# Patient Record
Sex: Male | Born: 1985 | Race: White | Hispanic: No | Marital: Single | Smoking: Former smoker
Health system: Southern US, Community
[De-identification: ages and names within clinical notes are randomized; demographics above are authoritative.]

## PROBLEM LIST (undated history)

## (undated) DIAGNOSIS — IMO0002 Reserved for concepts with insufficient information to code with codable children: Secondary | ICD-10-CM

## (undated) DIAGNOSIS — K2970 Gastritis, unspecified, without bleeding: Secondary | ICD-10-CM

## (undated) DIAGNOSIS — R51 Headache: Secondary | ICD-10-CM

## (undated) DIAGNOSIS — G4700 Insomnia, unspecified: Secondary | ICD-10-CM

## (undated) HISTORY — DX: Reserved for concepts with insufficient information to code with codable children: IMO0002

## (undated) HISTORY — DX: Insomnia, unspecified: G47.00

## (undated) HISTORY — DX: Headache: R51

## (undated) HISTORY — DX: Gastritis, unspecified, without bleeding: K29.70

---
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
## 2003-10-09 ENCOUNTER — Emergency Department (HOSPITAL_COMMUNITY): Admission: EM | Admit: 2003-10-09 | Discharge: 2003-10-10 | Payer: Self-pay | Admitting: Emergency Medicine

## 2004-11-24 ENCOUNTER — Emergency Department (HOSPITAL_COMMUNITY): Admission: EM | Admit: 2004-11-24 | Discharge: 2004-11-25 | Payer: Self-pay | Admitting: Emergency Medicine

## 2005-01-07 ENCOUNTER — Emergency Department (HOSPITAL_COMMUNITY): Admission: EM | Admit: 2005-01-07 | Discharge: 2005-01-07 | Payer: Self-pay | Admitting: Emergency Medicine

## 2005-02-13 ENCOUNTER — Emergency Department (HOSPITAL_COMMUNITY): Admission: EM | Admit: 2005-02-13 | Discharge: 2005-02-13 | Payer: Self-pay | Admitting: Emergency Medicine

## 2005-06-12 ENCOUNTER — Encounter: Admission: RE | Admit: 2005-06-12 | Discharge: 2005-06-12 | Payer: Self-pay | Admitting: Occupational Medicine

## 2006-01-15 ENCOUNTER — Ambulatory Visit: Payer: Self-pay | Admitting: Gastroenterology

## 2007-08-09 ENCOUNTER — Emergency Department (HOSPITAL_COMMUNITY): Admission: EM | Admit: 2007-08-09 | Discharge: 2007-08-09 | Payer: Self-pay | Admitting: Emergency Medicine

## 2008-06-23 ENCOUNTER — Emergency Department (HOSPITAL_COMMUNITY): Admission: EM | Admit: 2008-06-23 | Discharge: 2008-06-23 | Payer: Self-pay | Admitting: Family Medicine

## 2008-11-30 ENCOUNTER — Emergency Department (HOSPITAL_COMMUNITY): Admission: EM | Admit: 2008-11-30 | Discharge: 2008-11-30 | Payer: Self-pay | Admitting: Family Medicine

## 2008-12-02 ENCOUNTER — Emergency Department (HOSPITAL_COMMUNITY): Admission: EM | Admit: 2008-12-02 | Discharge: 2008-12-02 | Payer: Self-pay | Admitting: Family Medicine

## 2010-12-03 LAB — CBC
MCHC: 34.8 g/dL (ref 30.0–36.0)
Platelets: 139 10*3/uL — ABNORMAL LOW (ref 150–400)
RBC: 4.86 MIL/uL (ref 4.22–5.81)
WBC: 8.5 10*3/uL (ref 4.0–10.5)

## 2010-12-03 LAB — POCT I-STAT, CHEM 8
Calcium, Ion: 1.04 mmol/L — ABNORMAL LOW (ref 1.12–1.32)
HCT: 42 % (ref 39.0–52.0)
Sodium: 135 mEq/L (ref 135–145)
TCO2: 26 mmol/L (ref 0–100)

## 2010-12-03 LAB — DIFFERENTIAL
Basophils Absolute: 0 10*3/uL (ref 0.0–0.1)
Eosinophils Absolute: 0 10*3/uL (ref 0.0–0.7)
Lymphocytes Relative: 12 % (ref 12–46)
Monocytes Relative: 13 % — ABNORMAL HIGH (ref 3–12)

## 2010-12-03 LAB — STREP A DNA PROBE: Group A Strep Probe: NEGATIVE

## 2010-12-03 LAB — POCT RAPID STREP A (OFFICE): Streptococcus, Group A Screen (Direct): NEGATIVE

## 2011-01-09 NOTE — Procedures (Signed)
[place]. [facility]  Patient:    Ronald Schwartz, Ronald Schwartz Visit Number: [id number] MRN: [id number]          Service Type: Attending:  Fayrene Fearing L. Randa Evens, M.D. Dictated by:   Llana Aliment. Randa Evens, M.D. Proc. Date: 06/27/01   CC:         Dr. Beverley Fiedler, [facility]                           Procedure Report  PROCEDURE:  Colonoscopy and biopsy.  MEDICATIONS:  Fentanyl 100 mcg, Versed 10 mg IV.  SCOPE:  Pediatric Olympus video colonoscope.  INDICATION:  A 25 year old male with persistent diarrhea, weight loss, heme-positive stool.  This is done to look for signs of Crohns disease or ulcerative colitis.  DESCRIPTION OF PROCEDURE:  The procedure had been explained to the patient and consent obtained.  With the patient in the left lateral decubitus position, the Olympus pediatric video colonoscope was inserted, advanced under direct visualization.  The prep was excellent.  We were able to advance to the cecum. The ileocecal valve was identified and entered.  The terminal ileum was endoscopically normal.  Random biopsies were taken for evidence of inflammatory bowel disease.  The scope was withdrawn back, and the colon was completely normal throughout.  Multiple biopsies were taken and placed in jar #2, labeled as "random colon."  The scope was withdrawn and the patient tolerated the procedure well.  ASSESSMENT:  Essentially normal colonoscopy to the terminal ileum endoscopically.  PLAN:  Will check pathology and will recommend that we treat him at this point for inflammatory bowel disease.  He is not on any medication.  I will give him an empiric trial of Bentyl. Dictated by:   Llana Aliment. Randa Evens, M.D. Attending:  Llana Aliment. Randa Evens, M.D. DD:  06/27/01 TD:  06/28/01 Job: [id number]

## 2011-01-09 NOTE — Consult Note (Signed)
. [facility]  Patient:    Ronald Schwartz, Ronald Schwartz                      MRN: [id number] Proc. Date: 11/02/00 Adm. Date:  [id number] Attending:  Gerrianne Scale CC:         Posey Rea, M.D.   Consultation Report  DATE OF BIRTH:  1985-11-03  CHIEF COMPLAINT:  Possible seizures.  HISTORY OF PRESENT ILLNESS:  Ronald Schwartz is a 25 year old, right-handed, Caucasian young man who had an episode of screaming, shaking, and jerking that occurred around 1:30 in the morning which awakened him from sleep and caused him to be brought to [facility].  The [place] had [place] and had spent some time in the sauna. He had gone upstairs, felt tired, and felt a burning sensation on the back. He thought maybe that he had gotten too much sun and went down into the sauna. He could only stand it for a couple of minutes and developed a headache. Went back upstairs and went to sleep.  He awakened with a tingling sensation of both legs and muscle cramps. His back, legs, and neck hurt. He had generalized headache. He felt that it hurt if he moved anything. He had rigors and stuttering speech while shaking. He did not lose consciousness, but he has very little memory for this.  We are told that the patient had a temperature of 103. This is not documented in the history and physical examination of the patient. 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 It showed no red blood cells, no white blood cells, and normal glucose and protein. Gram stain was negative.  It appeared to the physician that the patient had very significant myalgias, and he prescribed Vicodin. The patient was evaluated with a urinalysis that showed 0 to 1 red blood cell, 2 to 5 white blood cells, trace bacteria.  The patient was taken from [facility] back home and was admitted to [facility]. His  temperatures ranged from 98.1 up to 101.8. This morning, the patient had shaking and jerking movements of his body, especially in the truncal area without loss of consciousness. The patient was treated with 1 mg of Ativan and given a bolus of fosphenytoin and 2 g of Rocephin.  I was asked to see him to determine the etiology of his dysfunction and to make recommendations for workup and treatment.  LABORATORY DATA:  Pertinent negatives here are that the patient had normal lumbar puncture, glucose 63, protein 30. Normal comprehensive metabolic panel and basically normal CBC, other than a neutrophil percentage of 94%. However, white count was only 8700.  The patient also has had a CT scan of the brain which was normal and had an EEG which I have just read that shows a mild amount of slowing and fast activity consistent with the use of benzodiazepines. The patient has had both narcotic analgesics and benzodiazepines, and they show up in his urine.  PAST MEDICAL HISTORY:  The patient has been a very healthy young man without significant medical problems. He injured his knees playing football but never had arthroscopic surgery. He has not had any recent infectious processes involving the head, neck, lungs, GI, GU, rash, anemia, bruisibility, diabetes, or thyroid disease. No joint disease other than his knees. No neurologic problems.  BIRTH HISTORY:  The patient was 7 pounds  3 ounces and was supposedly born at 44 weeks. He did not have the manifestations of a postdates child. He was born vertex vaginal delivery. Had no complications and went home with his mother. Growth and development were normal.  CURRENT MEDICATIONS:  None.  ALLERGIES:  None.  IMMUNIZATIONS:  Up to date.  FAMILY HISTORY:  Positive for meningocele in his sister. It is also positive for spasms that his mother and sister have had ______ . There is no history of seizures, mental retardation, blindness, deafness, or  consanguinity.  SOCIAL HISTORY:  The patient lives with his stepfather, younger sister, and mother. He is a Holiday representative at [employer] and is doing well in all but biology, obtaining a C/D. He used to run long distances in track and played wide receiver and cornerback for football but stopped because of his knee injuries.  ______ is also that mother has hypertension, COPD. She is her late 33s. Father is in his mid 6s. He is essentially well.  PHYSICAL EXAMINATION:  VITAL SIGNS:  Height 6 feet 1 inch, weight 151 pounds, head circumference 55 cm, temperature 98.6, blood pressure 149/74 (he is in pain), pulse 85, respirations 23, pulse oximetry 97%.  GENERAL:  This is a pleasant, well-developed, brown-haired, right-handed, young man in no distress.  HEENT:  No signs of infection.  LUNGS:  Clear to auscultation.  HEART:  No murmurs. Pulses normal.  ABDOMEN:  Soft, nontender. Bowel sounds normal.  EXTREMITIES:  Well-formed without cyanosis, edema, alterations in tone, or tight heel cords.  NEUROLOGICAL:  Mental status:  The patient was awake, alert, attentive, appropriate. No dysphagia or dyspraxia. Cranial nerve examination:  Round, reactive pupils. Normal fundi. Full visual fields to double simultaneous stimuli. Extraocular movements full and conjugate. OKN response is equal bilaterally. Visual acuity 20/30 o.d, 20/20 o.s. Symmetric facial strength. Midline tongue and uvula. Air conduction greater than bone conduction bilaterally.  MOTOR EXAMINATION:  Normal strength, tone, and mass. Good fine motor movements. No pronator drift. Sensation intact to cold, vibration, stereognosis. Cerebellar examination good finger-to-nose and rapid repetitive movements. Gait was not tested. He complained of back pain. He also complained of blurred vision which I suspect is related to the medicines he took. Deep tendon reflexes are diminished symmetrically. He had bilateral  flexor-plantar responses.   IMPRESSION:  Viral syndrome with rigors. Today, I have no explanation for his movement disorder. Without loss of consciousness, this is not seizures.  I appreciate the opportunity to see the patient. If you have questions or I can be of assistance, do not hesitate to contact me. DD:  11/02/00 TD:  11/02/00 Job: [id number]

## 2011-01-09 NOTE — Discharge Summary (Signed)
[place]. [facility]  Patient:    Ronald Schwartz, Ronald Schwartz                      MRN: [id number] Adm. Date:  [id number] Disc. Date: [id number] Attending:  Gerrianne Scale Dictator:   Anthony Sar CC:         Barton Fanny, M.D., [facility]  Deanna Artis. Sharene Skeans, M.D.   Discharge Summary  PRIMARY CARE PHYSICIAN:  Barton Fanny, M.D.  CONSULTATIONS PERFORMED:  Pediatric neurology, Deanna Artis. Sharene Skeans, M.D.  PROCEDURES: 1. An electroencephalogram was performed, which was found to be normal. 2. An electrocardiogram was normal.  DISCHARGE DIAGNOSES: 1. Shaking/jerking movements and muscle spasms, not seizures. 2. Viral illness.  DIET:  Regular.  HISTORY OF PRESENT ILLNESS:  Ronald Schwartz was admitted to [facility] on November 01, 2000, for episodes of shaking and jerking movements, headache, low-grade fever, and altered mental status.  His symptoms began the day before when he was in [place], [place] Ronald Schwartz.  He went to an outside hospital there where a lumbar puncture was performed, which revealed normal cerebrospinal fluid.  HOSPITAL COURSE:  Upon admission to our hospital, Ronald Schwartz was found to have a normal CT scan of the head.  Liver function enzymes and blood culture were also normal.  During the course of his hospitalization there, he had several episodes of muscle spasm and jerking movements which lasted anywhere from 10-20 minutes.  We initially managed the episode of movements with some Ativan, however, no medication was given for the subsequent episodes.  An EEG recording was obtained and read to be normal by pediatric neurology.  On discharge today, Ronald Schwartz is feeling better.  His headaches have resolved and his episodes of spasm this morning were shorter in duration and less in severity.  He is being discharged to home with follow-up plans to make an appointment with a person that he feels comfortable to talk with, either  his pastor, his guidance counselor at school, or a psychologist, so that he may discuss any issues that may be causing him stress or anxiety.  The pediatric attending here, Gerrianne Scale, M.D., has talked with [facility] primary care physician, Barton Fanny, M.D., regarding [facility] stay here at the hospital.  A copy of this discharge summary will be sent to Dr. Luciana Axe.  Also, a note from the pediatric psychologist, who talked with [facility], will be forwarded to Dr. Luciana Axe. DD:  11/03/00 TD:  11/04/00 Job: [id number]

## 2011-03-04 ENCOUNTER — Ambulatory Visit: Payer: Self-pay | Admitting: Internal Medicine

## 2011-03-04 DIAGNOSIS — Z0289 Encounter for other administrative examinations: Secondary | ICD-10-CM

## 2011-11-24 ENCOUNTER — Ambulatory Visit (INDEPENDENT_AMBULATORY_CARE_PROVIDER_SITE_OTHER): Payer: 59 | Admitting: Family Medicine

## 2011-11-24 VITALS — BP 108/74 | HR 64 | Temp 97.9°F | Resp 18 | Ht 73.5 in | Wt 194.0 lb

## 2011-11-24 DIAGNOSIS — Z113 Encounter for screening for infections with a predominantly sexual mode of transmission: Secondary | ICD-10-CM

## 2011-11-24 DIAGNOSIS — Z131 Encounter for screening for diabetes mellitus: Secondary | ICD-10-CM

## 2011-11-24 DIAGNOSIS — Z0000 Encounter for general adult medical examination without abnormal findings: Secondary | ICD-10-CM

## 2011-11-24 DIAGNOSIS — Z13220 Encounter for screening for lipoid disorders: Secondary | ICD-10-CM

## 2011-11-24 LAB — POCT CBC
Granulocyte percent: 73.9 %G (ref 37–80)
HCT, POC: 46.2 % (ref 43.5–53.7)
Hemoglobin: 14.9 g/dL (ref 14.1–18.1)
Lymph, poc: 1.5 (ref 0.6–3.4)
MCH, POC: 27.9 pg (ref 27–31.2)
MCHC: 32.3 g/dL (ref 31.8–35.4)
MCV: 86.5 fL (ref 80–97)
MID (cbc): 0.5 (ref 0–0.9)
MPV: 9.4 fL (ref 0–99.8)
POC Granulocyte: 5.7 (ref 2–6.9)
POC LYMPH PERCENT: 19.3 % (ref 10–50)
POC MID %: 6.8 % (ref 0–12)
Platelet Count, POC: 254 10*3/uL (ref 142–424)
RBC: 5.34 M/uL (ref 4.69–6.13)
RDW, POC: 13.7 %
WBC: 7.7 10*3/uL (ref 4.6–10.2)

## 2011-11-24 LAB — COMPREHENSIVE METABOLIC PANEL WITH GFR
ALT: 16 U/L (ref 0–53)
AST: 19 U/L (ref 0–37)
CO2: 26 meq/L (ref 19–32)
Chloride: 102 meq/L (ref 96–112)
Creat: 1.08 mg/dL (ref 0.50–1.35)
Glucose, Bld: 89 mg/dL (ref 70–99)
Potassium: 4.1 meq/L (ref 3.5–5.3)

## 2011-11-24 LAB — COMPREHENSIVE METABOLIC PANEL
Albumin: 5.1 g/dL (ref 3.5–5.2)
Alkaline Phosphatase: 62 U/L (ref 39–117)
BUN: 10 mg/dL (ref 6–23)
Calcium: 9.7 mg/dL (ref 8.4–10.5)
Sodium: 141 mEq/L (ref 135–145)
Total Bilirubin: 0.4 mg/dL (ref 0.3–1.2)
Total Protein: 7.5 g/dL (ref 6.0–8.3)

## 2011-11-24 LAB — POCT GLYCOSYLATED HEMOGLOBIN (HGB A1C): Hemoglobin A1C: 5

## 2011-11-24 NOTE — Progress Notes (Signed)
  [facility]:  Office Visit  Chief Complaint:  Chief Complaint  Patient presents with  . Employment Physical    for work [place] seas    HPI: Ronald Schwartz is a 26 y.o. male who complains of  CPE. No medical problems. Very strong history of heart disease, diabetes.  Father is deceased, had first MI in late 31s, early 63s.  Mother is alive recently dx with lung cancer, first MI 28 y/o s/p triple bypass in 2010.   Past Medical History  Diagnosis Date  . Gastritis    History reviewed. No pertinent past surgical history. History   Social History  . Marital Status: Single    Spouse Name: N/A    Number of Children: N/A  . Years of Education: N/A   Social History Main Topics  . Smoking status: Former Smoker    Quit date: 11/23/2005  . Smokeless tobacco: Former Neurosurgeon  . Alcohol Use: Yes     3-5 beers per night  occasion  . Drug Use: None  . Sexually Active: None   Other Topics Concern  . None   Social History Narrative  . None   Family History  Problem Relation Age of Onset  . Heart disease Mother   . Hyperlipidemia Mother   . COPD Mother   . Stroke Mother   . Diabetes Father   . Heart disease Father    Allergies no known allergies Prior to Admission medications   Not on File     ROS: The patient denies fevers, chills, night sweats, unintentional weight loss, chest pain, palpitations, wheezing, dyspnea on exertion, nausea, vomiting, abdominal pain, dysuria, hematuria, melena, numbness, weakness, or tingling.   All other systems have been reviewed and were otherwise negative with the exception of those mentioned in the HPI and as above.    PHYSICAL EXAM: Filed Vitals:   11/24/11 1327  BP: 108/74  Pulse: 64  Temp: 97.9 F (36.6 C)  Resp: 18   Filed Vitals:   11/24/11 1327  Height: 6' 1.5" (1.867 m)  Weight: 194 lb (87.998 kg)   Body mass index is 25.25 kg/(m^2).  General: Alert, no acute distress HEENT:  Normocephalic, atraumatic,  oropharynx patent. EOMI, PERRLA, fundoscopic exam nl.  Cardiovascular:  Regular rate and rhythm, no rubs murmurs or gallops.  No Carotid bruits, radial pulse intact. No pedal edema.  Respiratory: Clear to auscultation bilaterally.  No wheezes, rales, or rhonchi.  No cyanosis, no use of accessory musculature GI: No organomegaly, abdomen is soft and non-tender, positive bowel sounds.  No masses. Skin: No rashes. Neurologic: Facial musculature symmetric. Psychiatric: Patient is appropriate throughout our interaction. Lymphatic: No cervical lymphadenopathy Musculoskeletal: Gait intact. GU: no testicular mas. Lesion, no rashes, scrotum normal, no hernias   LABS:  EKG/XRAY:   Primary read interpreted by Dr. Conley Rolls at [facility].   ASSESSMENT/PLAN: Encounter Diagnoses  Name Primary?  . Annual physical exam Yes  . Screening for hyperlipidemia   . Screening for diabetes mellitus   . Screening for STD (sexually transmitted disease)    Patient is doing well. He is healthy, has not had any problems medically this year. Has a strong h/o MI and cardiac disease in family. Will do screening for all the pertinent things.  Patient will come back for fasting lipid since just ate hamburger 2 hours ago.    Miia Blanks PHUONG, DO 11/24/2011 2:50 PM

## 2011-11-25 LAB — GC/CHLAMYDIA PROBE AMP, URINE
Chlamydia, Swab/Urine, PCR: NEGATIVE
GC Probe Amp, Urine: NEGATIVE

## 2011-11-25 LAB — RPR

## 2011-11-25 LAB — HSV(HERPES SIMPLEX VRS) I + II AB-IGG
HSV 1 Glycoprotein G Ab, IgG: 10.32 IV — ABNORMAL HIGH
HSV 2 Glycoprotein G Ab, IgG: <0.1 IV

## 2011-11-25 LAB — HIV ANTIBODY (ROUTINE TESTING W REFLEX): HIV: NONREACTIVE

## 2011-12-17 ENCOUNTER — Encounter: Payer: Self-pay | Admitting: Family Medicine

## 2012-01-04 ENCOUNTER — Encounter: Payer: Self-pay | Admitting: Family

## 2012-01-04 ENCOUNTER — Other Ambulatory Visit: Payer: Self-pay | Admitting: Family

## 2012-01-04 ENCOUNTER — Ambulatory Visit (INDEPENDENT_AMBULATORY_CARE_PROVIDER_SITE_OTHER): Payer: Self-pay | Admitting: Family

## 2012-01-04 VITALS — BP 140/90 | Temp 98.9°F | Ht 75.0 in | Wt 204.0 lb

## 2012-01-04 DIAGNOSIS — R51 Headache: Secondary | ICD-10-CM

## 2012-01-04 DIAGNOSIS — H538 Other visual disturbances: Secondary | ICD-10-CM

## 2012-01-04 LAB — CBC WITH DIFFERENTIAL/PLATELET
Basophils Absolute: 0 10*3/uL (ref 0.0–0.1)
Eosinophils Absolute: 0.4 10*3/uL (ref 0.0–0.7)
Eosinophils Relative: 6.1 % — ABNORMAL HIGH (ref 0.0–5.0)
Hemoglobin: 14.6 g/dL (ref 13.0–17.0)
Lymphocytes Relative: 22.8 % (ref 12.0–46.0)
Lymphs Abs: 1.3 10*3/uL (ref 0.7–4.0)
Monocytes Absolute: 0.5 10*3/uL (ref 0.1–1.0)
Monocytes Relative: 7.9 % (ref 3.0–12.0)
Neutrophils Relative %: 62.5 % (ref 43.0–77.0)
RDW: 13.1 % (ref 11.5–14.6)

## 2012-01-04 LAB — BASIC METABOLIC PANEL
CO2: 27 mEq/L (ref 19–32)
Calcium: 8.3 mg/dL — ABNORMAL LOW (ref 8.4–10.5)
Chloride: 98 mEq/L (ref 96–112)
GFR: 85.6 mL/min (ref >60.00)
Glucose, Bld: 64 mg/dL — ABNORMAL LOW (ref 70–99)
Potassium: 3.8 mEq/L (ref 3.5–5.1)
Sodium: 139 mEq/L (ref 135–145)

## 2012-01-04 LAB — TSH: TSH: 0.9 u[IU]/mL (ref 0.35–5.50)

## 2012-01-04 MED ORDER — TRAZODONE HCL 50 MG PO TABS: 25.0000 mg | ORAL_TABLET | Freq: Every evening | ORAL | Status: AC | PRN

## 2012-01-04 MED ORDER — TRAMADOL HCL 50 MG PO TABS
50.0000 mg | ORAL_TABLET | Freq: Four times a day (QID) | ORAL | Status: AC | PRN
Start: 2012-01-04 — End: 2012-01-14

## 2012-01-04 NOTE — Telephone Encounter (Signed)
Pt would like to have something for headaches and sleeping call into [facility] [phone]

## 2012-01-04 NOTE — Patient Instructions (Signed)

## 2012-01-04 NOTE — Telephone Encounter (Signed)
Done

## 2012-01-04 NOTE — Progress Notes (Signed)
Subjective:    Patient ID: Ronald Schwartz, male    DOB: [birth year], 26 y.o.   MRN: [id number]  HPI 26 year old white male, nonsmoker, new patient to the practice is in with complaints of a constant headache times one month. The headache originally began in the back dictated and has spread to his entire head. He rates the headache today a 4/10 but can be as bad as a 10 out of 10. Describes the headache throbbing. He began taken and allergy medication and migraine medication in the there have been effective. Does admit to nausea periodically, but no vomiting, no sneezing, cough, congestion. He consumes about 3-once bottles of caffeine a day. Denies any increase in stress. He is employed as a Emergency planning/management officer.  Review of Systems  Constitutional: Negative.   HENT: Negative for congestion, rhinorrhea, postnasal drip and sinus pressure.   Eyes: Positive for visual disturbance. Negative for photophobia and redness.  Respiratory: Negative.   Cardiovascular: Negative.   Gastrointestinal: Negative.   Genitourinary: Negative.   Musculoskeletal: Negative.   Skin: Negative.   Neurological: Positive for headaches. Negative for dizziness and light-headedness.  Psychiatric/Behavioral: Negative.    Past Medical History  Diagnosis Date  . Gastritis   . Insomnia   . Ulcer   . Headache     History   Social History  . Marital Status: Single    Spouse Name: N/A    Number of Children: N/A  . Years of Education: N/A   Occupational History  . Not on file.   Social History Main Topics  . Smoking status: Former Smoker    Quit date: 11/23/2005  . Smokeless tobacco: Former Neurosurgeon  . Alcohol Use: Yes     3-5 beers per night  occasion  . Drug Use: No  . Sexually Active: Not on file   Other Topics Concern  . Not on file   Social History Narrative  . No narrative on file    No past surgical history on file.  Family History  Problem Relation Age of Onset  . Heart disease Mother   .  Hyperlipidemia Mother   . COPD Mother   . Stroke Mother   . Diabetes Father   . Heart disease Father     No Known Allergies  No current outpatient prescriptions on file prior to visit.    BP 140/90  Temp(Src) 98.9 F (37.2 C) (Oral)  Ht 6\' 3"  (1.905 m)  Wt 204 lb (92.534 kg)  BMI 25.50 kg/m2chart    Objective:   Physical Exam  Constitutional: He is oriented to person, place, and time. He appears well-developed and well-nourished.  HENT:  Head: Normocephalic and atraumatic.  Right Ear: External ear normal.  Left Ear: External ear normal.  Nose: Nose normal.  Mouth/Throat: Oropharynx is clear and moist.  Eyes: Conjunctivae and EOM are normal. Pupils are equal, round, and reactive to light.  Neck: Normal range of motion. Neck supple.  Cardiovascular: Normal rate, regular rhythm and normal heart sounds.   Pulmonary/Chest: Effort normal and breath sounds normal.  Abdominal: Soft. Bowel sounds are normal.  Musculoskeletal: Normal range of motion.  Neurological: He is alert and oriented to person, place, and time. He has normal reflexes.  Skin: Skin is warm and dry.  Psychiatric: He has a normal mood and affect.          Assessment & Plan:  Assessment: Headache, blurry vision  Plan: Labs today to include ESR, BMP, CBC, TSH. Will notify patient pending  results. CT scan of the head without contrast given his family history and the characteristics of his headache. Will discuss further treatment plan at that time. Encouraged him to decrease his caffeine intake in the meantime. Drink more water. Patient to call the office if symptoms worsen or persist. Recheck a schedule, and when necessary.

## 2012-01-05 ENCOUNTER — Telehealth: Payer: Self-pay

## 2012-01-05 NOTE — Telephone Encounter (Signed)
Message copied by Beverely Low on Tue Jan 05, 2012  4:10 PM ------      Message from: Adline Mango B      Created: Tue Jan 05, 2012 10:47 AM       Labs stable. Proceed with CT head

## 2012-01-05 NOTE — Telephone Encounter (Signed)
Pt aware and notes that his CT is scheduled for tomorrow at 4.  He also notes that meds are not working at all. He has had 3 doses of the tramadol that has done nothing for him. States that he had a bad episode last night. Pt notes that after taking the trazodone he was awake again form 1-5am. He would like to possibly try a migraine medication

## 2012-01-05 NOTE — Telephone Encounter (Signed)
Per Padonda, provide pt with samples of relpax and zomig. Advise pt of instructions and make pt aware that he should not take these meds simultaneously. Pt should take one at onset of HA and another 2 hrs later if migraine is not better. Advised pt that he should also rest after taking meds. He is aware and verbalized understanding. Samples ready to pick up

## 2012-01-05 NOTE — Telephone Encounter (Signed)
Message copied by Beverely Low on Tue Jan 05, 2012  4:20 PM ------      Message from: Adline Mango B      Created: Tue Jan 05, 2012 10:47 AM       Labs stable. Proceed with CT head

## 2012-01-06 ENCOUNTER — Ambulatory Visit (INDEPENDENT_AMBULATORY_CARE_PROVIDER_SITE_OTHER)
Admission: RE | Admit: 2012-01-06 | Discharge: 2012-01-06 | Disposition: A | Discharge status: Home / Self Care | Payer: 59 | Source: Ambulatory Visit | Attending: Family | Admitting: Family

## 2012-01-06 DIAGNOSIS — H538 Other visual disturbances: Secondary | ICD-10-CM

## 2012-01-06 DIAGNOSIS — R51 Headache: Secondary | ICD-10-CM

## 2012-02-03 ENCOUNTER — Telehealth: Payer: Self-pay | Admitting: Family

## 2012-02-03 NOTE — Telephone Encounter (Signed)
Ok to complete note? 

## 2012-02-03 NOTE — Telephone Encounter (Signed)
Pt is a Designer, fashion/clothing and needs a note stating that it is beneficial for him to wear a Nylon Belt instead of the standard leather belt due to his back issues. Pt states the nylon belt is better on his back

## 2012-02-08 NOTE — Telephone Encounter (Signed)
Left message to advise pt to call back to let me know if he wants to pick letter up, fax, or mail

## 2012-02-09 NOTE — Telephone Encounter (Signed)
Pt will pick note up 02/10/12

## 2012-03-26 ENCOUNTER — Encounter: Payer: Self-pay | Admitting: Family Medicine

## 2012-03-26 ENCOUNTER — Ambulatory Visit (INDEPENDENT_AMBULATORY_CARE_PROVIDER_SITE_OTHER): Payer: 59 | Admitting: Family Medicine

## 2012-03-26 VITALS — BP 120/80 | Temp 98.4°F | Wt 199.0 lb

## 2012-03-26 DIAGNOSIS — T6391XA Toxic effect of contact with unspecified venomous animal, accidental (unintentional), initial encounter: Secondary | ICD-10-CM

## 2012-03-26 DIAGNOSIS — T63621A Toxic effect of contact with other jellyfish, accidental (unintentional), initial encounter: Secondary | ICD-10-CM

## 2012-03-26 MED ORDER — PREDNISONE 20 MG PO TABS: ORAL_TABLET | ORAL | Status: AC

## 2012-03-26 MED ORDER — TRIAMCINOLONE ACETONIDE 0.1 % EX CREA: TOPICAL_CREAM | Freq: Two times a day (BID) | CUTANEOUS | Status: AC

## 2012-03-26 NOTE — Progress Notes (Signed)
   Nature conservation officer at [facility] [address] [place] [address] Phone: [phone] Fax: [phone]  Date:  03/26/2012   Name:  Ronald Schwartz   DOB:  08-01-1986   MRN:  [id number]  PCP:  Janell Quiet, FNP    Chief Complaint: Rash   History of Present Illness:  Ronald Schwartz is a 26 y.o. very pleasant male patient who presents with the following:  2 weeks ago, stung by jellyfish, went up his shorts. Initially burning, painful rash, then develops itchiness that will not go away. Is a Emergency planning/management officer, has to wear a bulletproof vest, hot.  Past Medical History, Surgical History, Social History, Family History, Problem List, Medications, and Allergies have been reviewed and updated if relevant.  Current Outpatient Prescriptions on File Prior to Visit  Medication Sig Dispense Refill  . traZODone (DESYREL) 50 MG tablet Take 0.5-1 tablets (25-50 mg total) by mouth at bedtime as needed for sleep.  30 tablet  0    Review of Systems:  GEN: No acute illnesses, no fevers, chills. GI: No n/v/d, eating normally Pulm: No SOB Interactive and getting along well at home.  Otherwise, ROS is as per the HPI.   Physical Examination: Filed Vitals:   03/26/12 0908  BP: 120/80  Temp: 98.4 F (36.9 C)   Filed Vitals:   03/26/12 0908  Weight: 199 lb (90.266 kg)   There is no height on file to calculate BMI. Ideal Body Weight:     GEN: WDWN, NAD, Non-toxic, Alert & Oriented x 3 HEENT: Atraumatic, Normocephalic.  Ears and Nose: No external deformity. EXTR: No clubbing/cyanosis/edema NEURO: Normal gait.  PSYCH: Normally interactive. Conversant. Not depressed or anxious appearing.  Calm demeanor.  SKIN: Some on legs, post lower back, gluteal folds and groin with red macular rash with small, raised scatted bumps  Assessment and Plan:  1. Jellyfish sting  predniSONE (DELTASONE) 20 MG tablet, triamcinolone cream (KENALOG) 0.1 %    Orders Today:  No orders of the  defined types were placed in this encounter.    Medications Today: (Includes new updates added during medication reconciliation) Meds ordered this encounter  Medications  . predniSONE (DELTASONE) 20 MG tablet    Sig: 2 tabs po daily for 3 days, then 1 po daily for 4 days    Dispense:  10 tablet    Refill:  0  . triamcinolone cream (KENALOG) 0.1 %    Sig: Apply topically 2 (two) times daily.    Dispense:  85.2 g    Refill:  1     Hannah Beat, MD

## 2013-01-06 ENCOUNTER — Encounter: Payer: 59 | Admitting: Family

## 2013-01-06 DIAGNOSIS — Z0289 Encounter for other administrative examinations: Secondary | ICD-10-CM

## 2013-02-15 ENCOUNTER — Telehealth: Payer: Self-pay | Admitting: Family

## 2013-02-15 NOTE — Telephone Encounter (Addendum)
Pt said his employer lost last note. Pt needs another note stating it is beneficial for him to wear a nylon belt instead of standard leather belt due to back issues. Please fax note to attn Sherrine Maples [phone]

## 2013-02-15 NOTE — Telephone Encounter (Signed)
Note printed and faxed.

## 2013-09-05 IMAGING — CT CT HEAD W/O CM
1 series · 16 of 30 positions shown, 20 images · non-contrast
Comparison: 08/09/2007

CLINICAL DATA: Headache for 1 month, nausea with blurred vision.

CT HEAD WITHOUT CONTRAST
TECHNIQUE: Contiguous axial images were obtained from the base of
the skull through the vertex without contrast

[Series 2: head_seq -c 4.5 h37s st · axial · 0.43mm/px · z∈[-76,+50]mm · 16 of 32 slices shown, 20 images]
[im 2/32  brain]
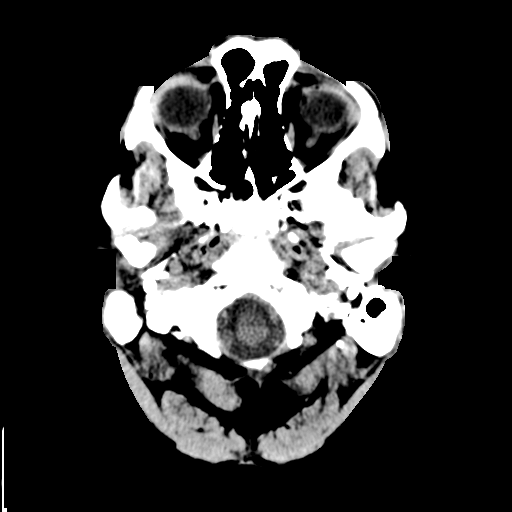
[im 2/32  bone]
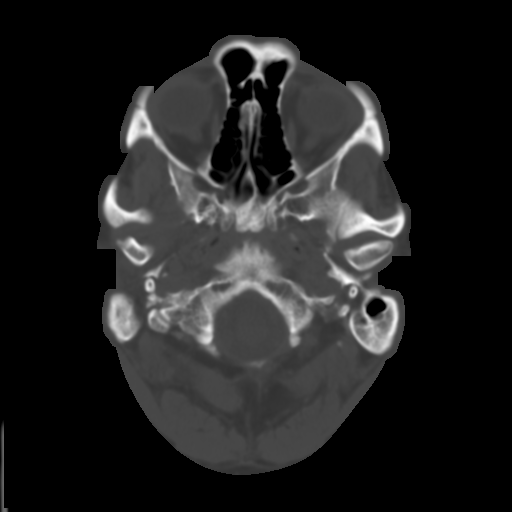
[im 4/32  brain]
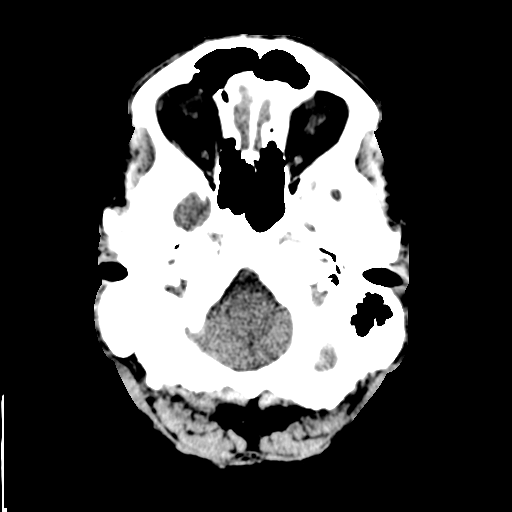
[im 6/32  brain]
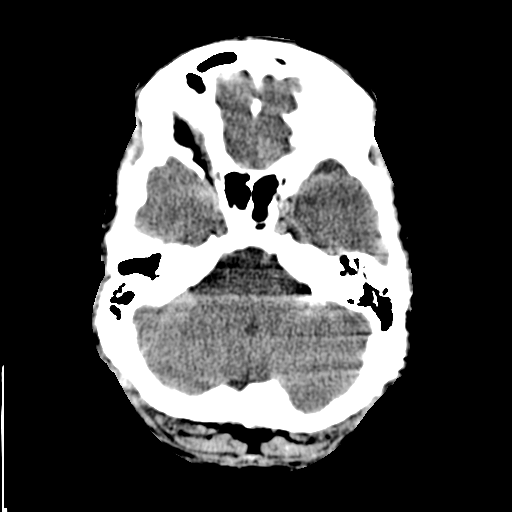
[im 8/32  brain]
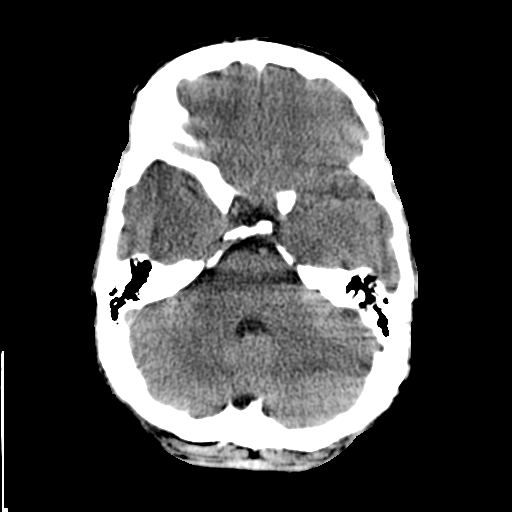
[im 9/32  brain]
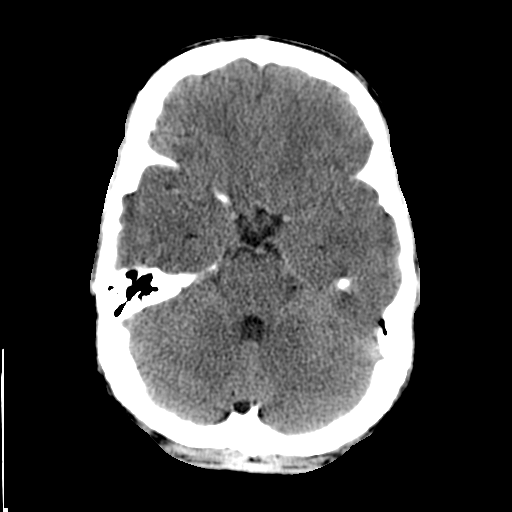
[im 9/32  bone]
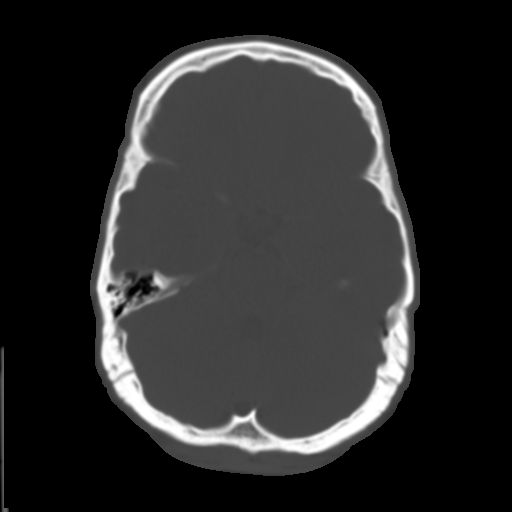
[im 11/32  brain]
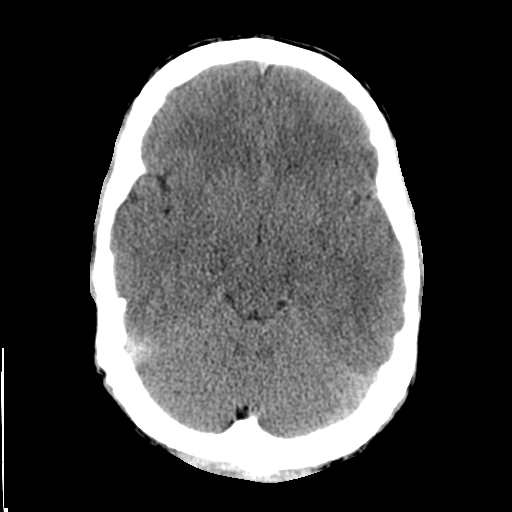
[im 13/32  brain]
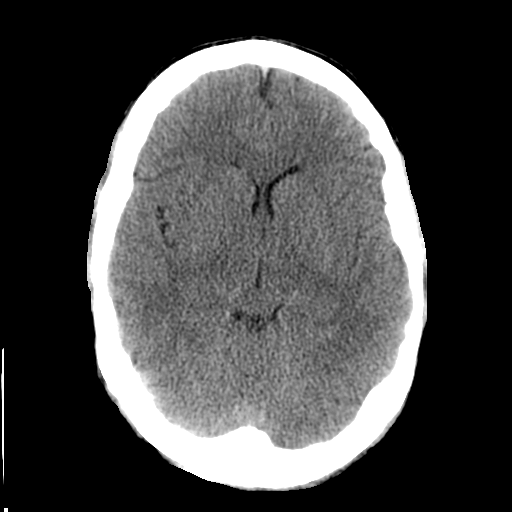
[im 15/32  brain]
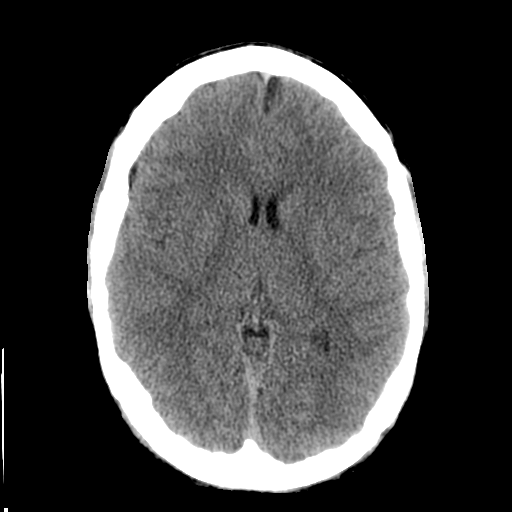
[im 17/32  brain]
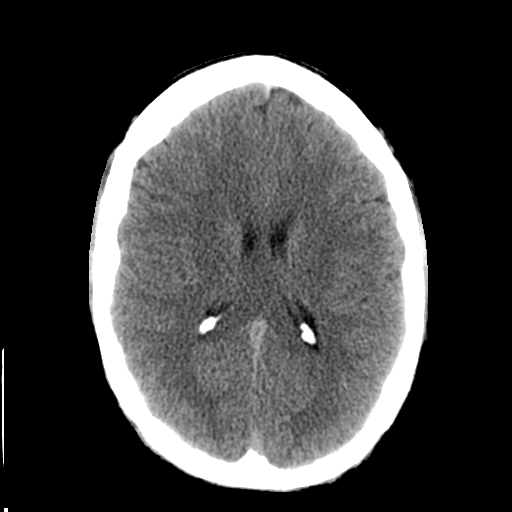
[im 17/32  bone]
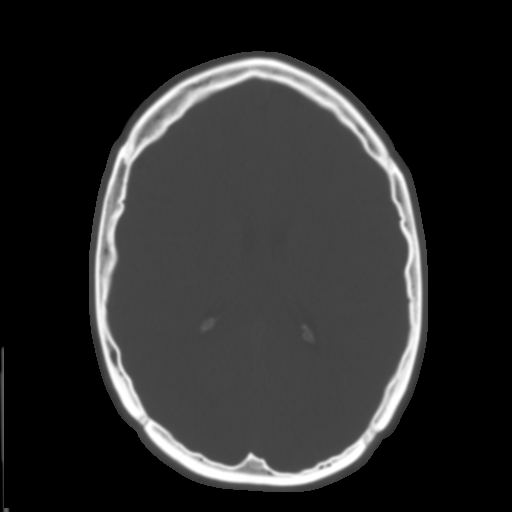
[im 19/32  brain]
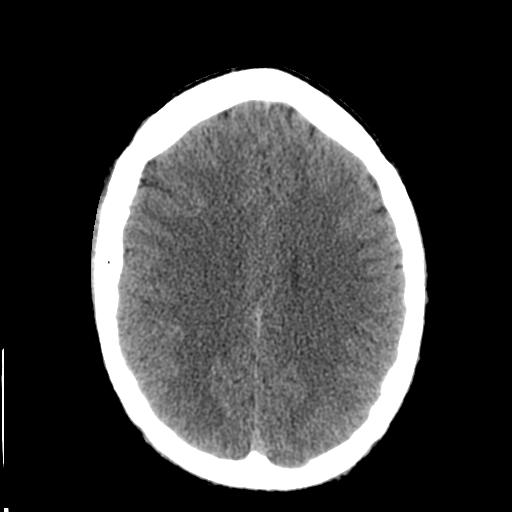
[im 21/32  brain]
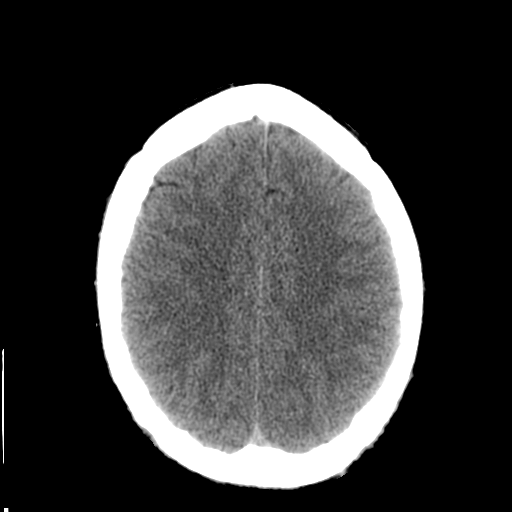
[im 23/32  brain]
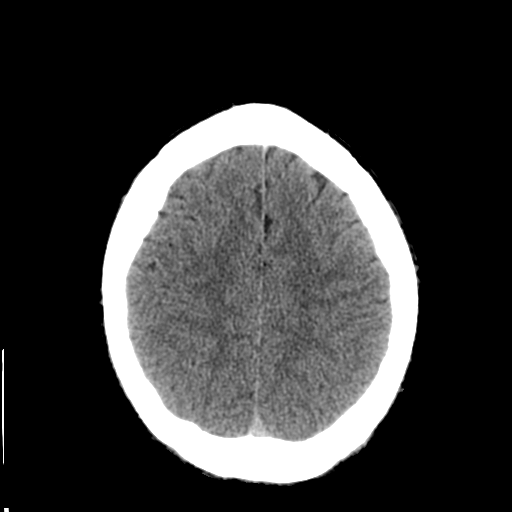
[im 24/32  brain]
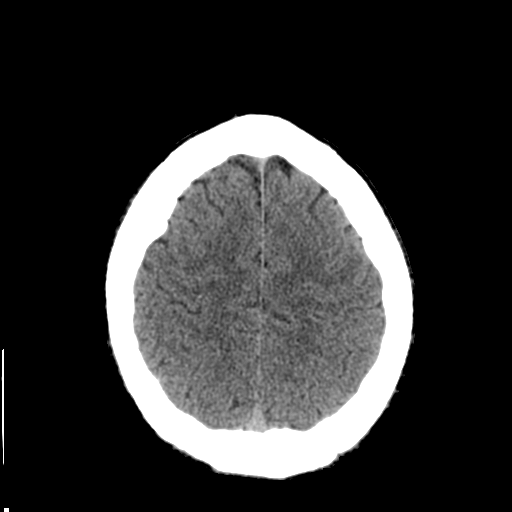
[im 24/32  bone]
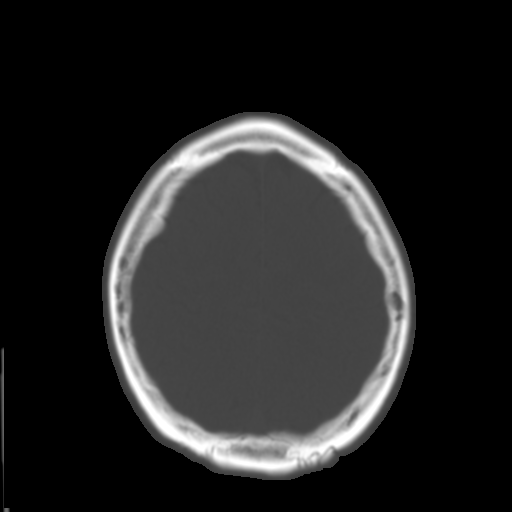
[im 26/32  brain]
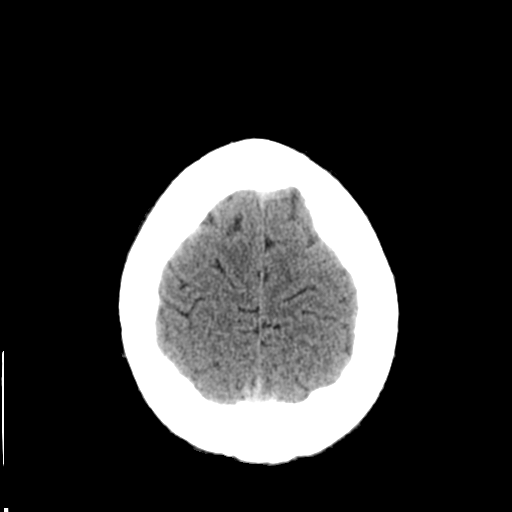
[im 28/32  brain]
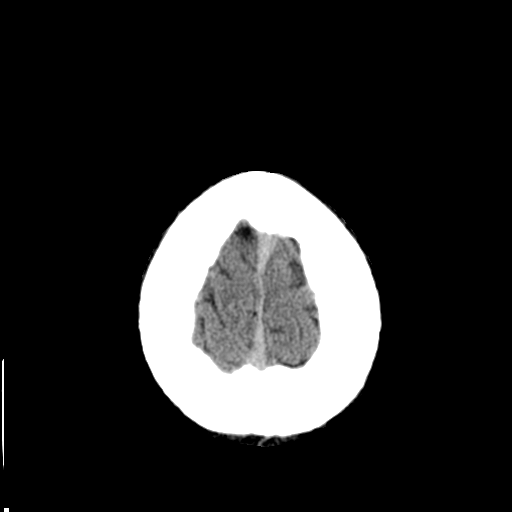
[im 30/32  brain]
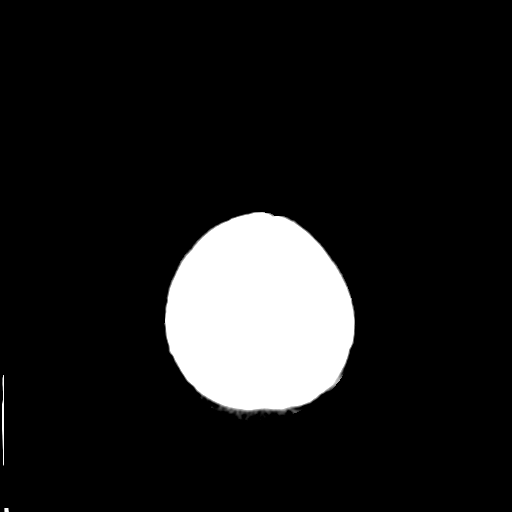

[16 of 30 positions shown; findings below may reference images not displayed]

FINDINGS: The brain has a normal appearance without evidence for
hemorrhage, acute infarction, hydrocephalus, or mass lesion.  There
is no extra axial fluid collection.  The skull and paranasal
sinuses are normal. No change from prior normal study
IMPRESSION: Normal CT of the head without contrast.

## 2014-04-13 ENCOUNTER — Emergency Department (HOSPITAL_COMMUNITY): Payer: Worker's Compensation

## 2014-04-13 ENCOUNTER — Encounter (HOSPITAL_COMMUNITY): Payer: Self-pay | Admitting: Emergency Medicine

## 2014-04-13 ENCOUNTER — Emergency Department (HOSPITAL_COMMUNITY)
Admission: EM | Admit: 2014-04-13 | Discharge: 2014-04-14 | Disposition: A | Discharge status: Home / Self Care | Payer: Worker's Compensation | Attending: Emergency Medicine | Admitting: Emergency Medicine

## 2014-04-13 DIAGNOSIS — X500XXA Overexertion from strenuous movement or load, initial encounter: Secondary | ICD-10-CM | POA: Insufficient documentation

## 2014-04-13 DIAGNOSIS — Z872 Personal history of diseases of the skin and subcutaneous tissue: Secondary | ICD-10-CM | POA: Insufficient documentation

## 2014-04-13 DIAGNOSIS — S63602A Unspecified sprain of left thumb, initial encounter: Secondary | ICD-10-CM

## 2014-04-13 DIAGNOSIS — Z87891 Personal history of nicotine dependence: Secondary | ICD-10-CM | POA: Insufficient documentation

## 2014-04-13 DIAGNOSIS — Y9389 Activity, other specified: Secondary | ICD-10-CM | POA: Insufficient documentation

## 2014-04-13 DIAGNOSIS — S6390XA Sprain of unspecified part of unspecified wrist and hand, initial encounter: Secondary | ICD-10-CM | POA: Diagnosis not present

## 2014-04-13 DIAGNOSIS — S6990XA Unspecified injury of unspecified wrist, hand and finger(s), initial encounter: Secondary | ICD-10-CM | POA: Insufficient documentation

## 2014-04-13 DIAGNOSIS — IMO0002 Reserved for concepts with insufficient information to code with codable children: Secondary | ICD-10-CM | POA: Insufficient documentation

## 2014-04-13 DIAGNOSIS — S6980XA Other specified injuries of unspecified wrist, hand and finger(s), initial encounter: Secondary | ICD-10-CM | POA: Insufficient documentation

## 2014-04-13 DIAGNOSIS — Y9289 Other specified places as the place of occurrence of the external cause: Secondary | ICD-10-CM | POA: Insufficient documentation

## 2014-04-13 DIAGNOSIS — Z8719 Personal history of other diseases of the digestive system: Secondary | ICD-10-CM | POA: Diagnosis not present
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
## 2014-04-14 MED ORDER — OXYCODONE-ACETAMINOPHEN 5-325 MG PO TABS: 1.0000 | ORAL_TABLET | Freq: Four times a day (QID) | ORAL | Status: AC | PRN

## 2014-04-14 NOTE — Progress Notes (Signed)
Orthopedic Tech Progress Note Patient Details:  Ronald Schwartz 08/12/1986 [id number]      [name], [name] 04/14/2014, 12:50 AM

## 2014-04-14 NOTE — ED Notes (Signed)
Ortho called to place splint. 

## 2014-04-14 NOTE — Discharge Instructions (Signed)
Thumb Sprain °Your exam shows you have a sprained thumb. This means the ligaments around the joint have been torn. Thumb sprains usually take 3-6 weeks to heal. However, severe, unstable sprains may need to be fixed surgically. Sometimes a small piece of bone is pulled off by the ligament. If this is not treated properly, a sprained thumb can lead to a painful, weak joint. Treatment helps reduce pain and shortens the period of disability. °The thumb, and often the wrist, must remain splinted for the first 2-4 weeks to protect the joint. Keep your hand elevated and apply ice packs frequently to the injured area (20-30 minutes every 2-3 hours) for the next 2-4 days. This helps reduce swelling and control pain. Pain medicine may also be used for several days. Motion and strengthening exercises may later be prescribed for the joint to return to normal function. Be sure to see your doctor for follow-up because your thumb joint may require further support with splints, bandages or tape. Please see your doctor or go to the emergency room right away if you have increased pain despite proper treatment, or a numb, cold, or pale thumb. °Document Released: 09/17/2004 Document Revised: 11/02/2011 Document Reviewed: 08/11/2008 °[employer]® Patient Information ©2015 [employer]. This information is not intended to replace advice given to you by your health care provider. Make sure you discuss any questions you have with your health care provider. ° °

## 2014-04-24 NOTE — ED Provider Notes (Signed)
Medical screening examination/treatment/procedure(s) were performed by non-physician practitioner and as supervising physician I was immediately available for consultation/collaboration.   EKG Interpretation None       Hurman Horn, MD 04/24/14 2036

## 2015-12-12 IMAGING — CR DG FINGER THUMB 2+V*L*
3 series · 3 of 3 positions shown · non-contrast
Comparison: None.

CLINICAL DATA: 28-year-old male with blunt trauma left thumb pain
swelling and numbness. Initial encounter.

EXAM:
LEFT THUMB 2+V

[x finger pa left]
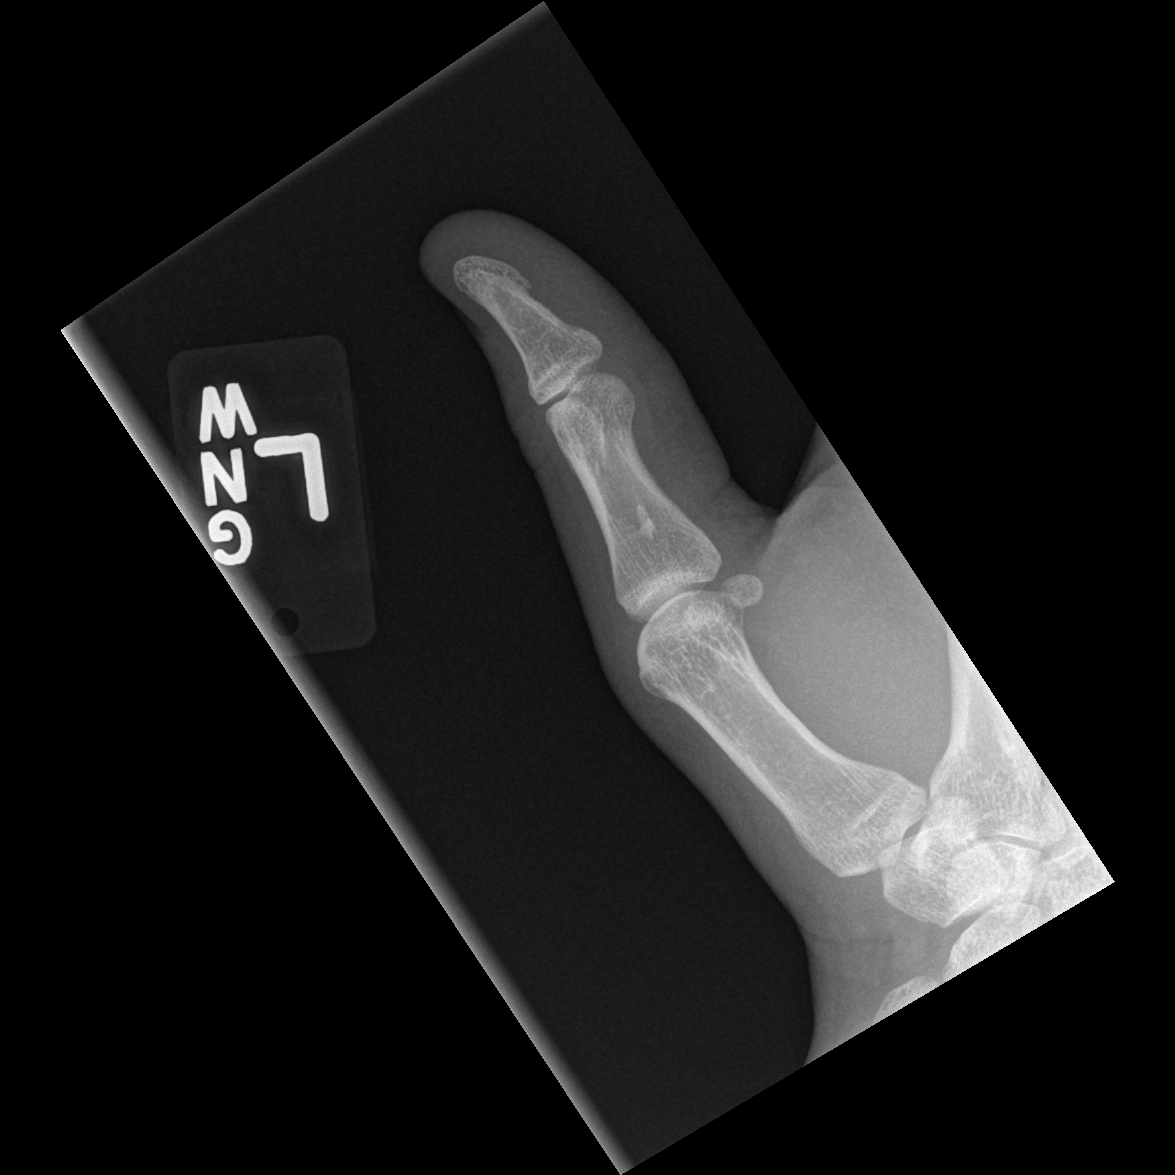

[x finger obl. left]
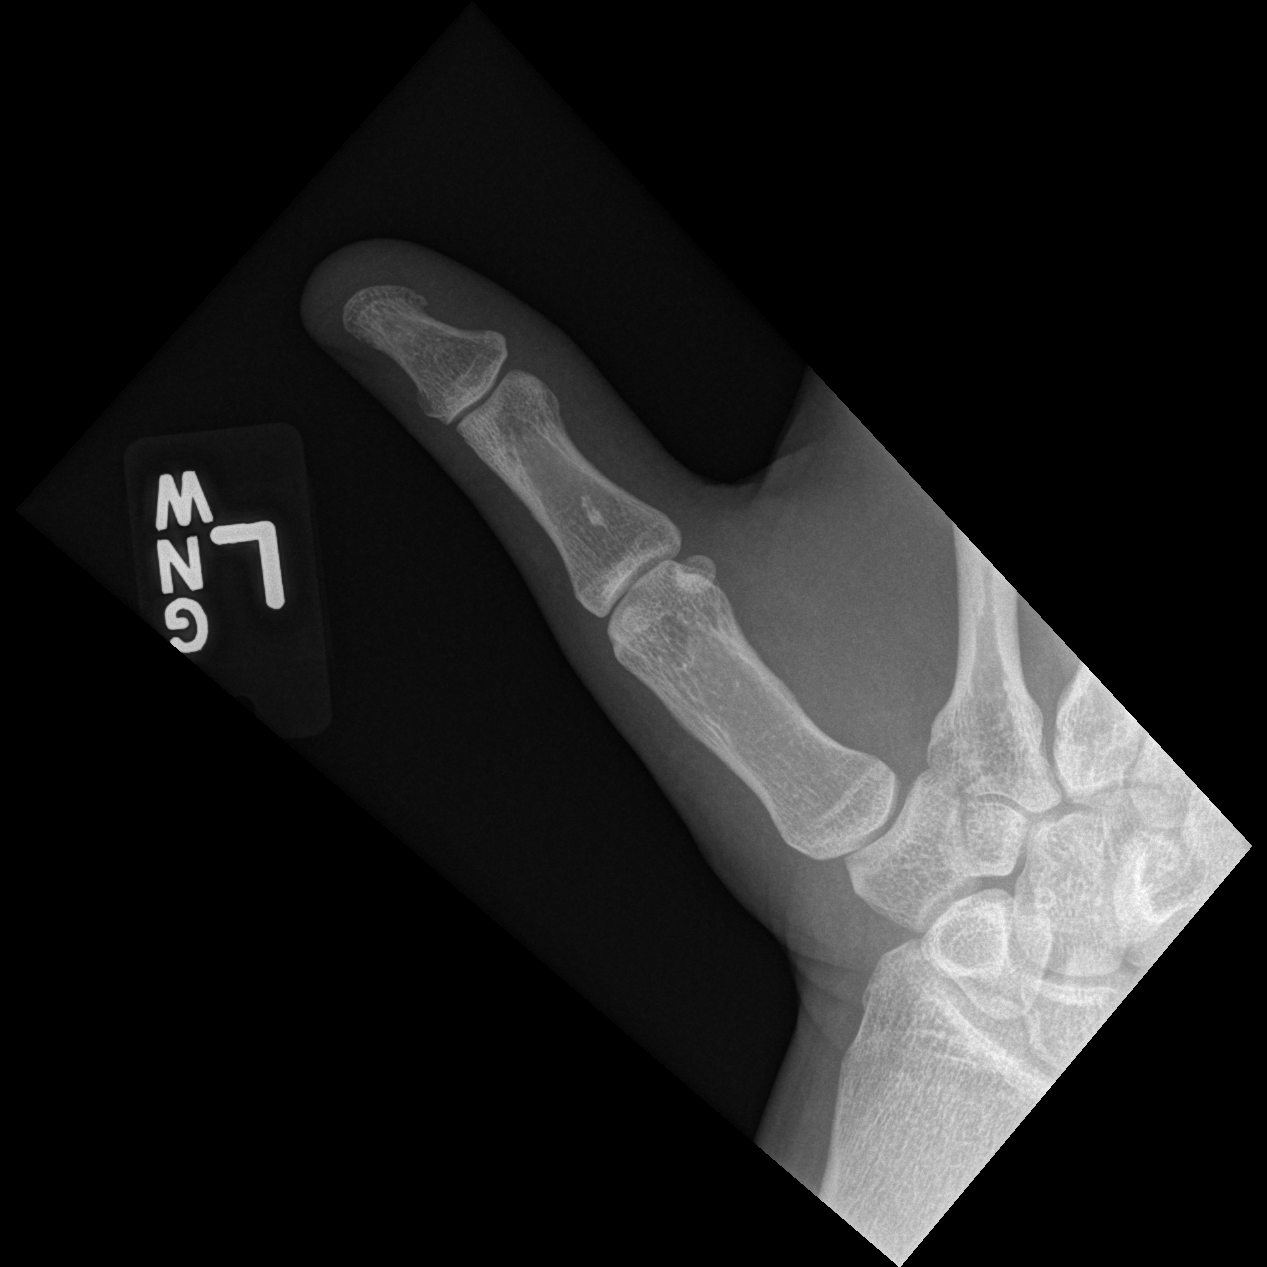

[x finger lateral left]
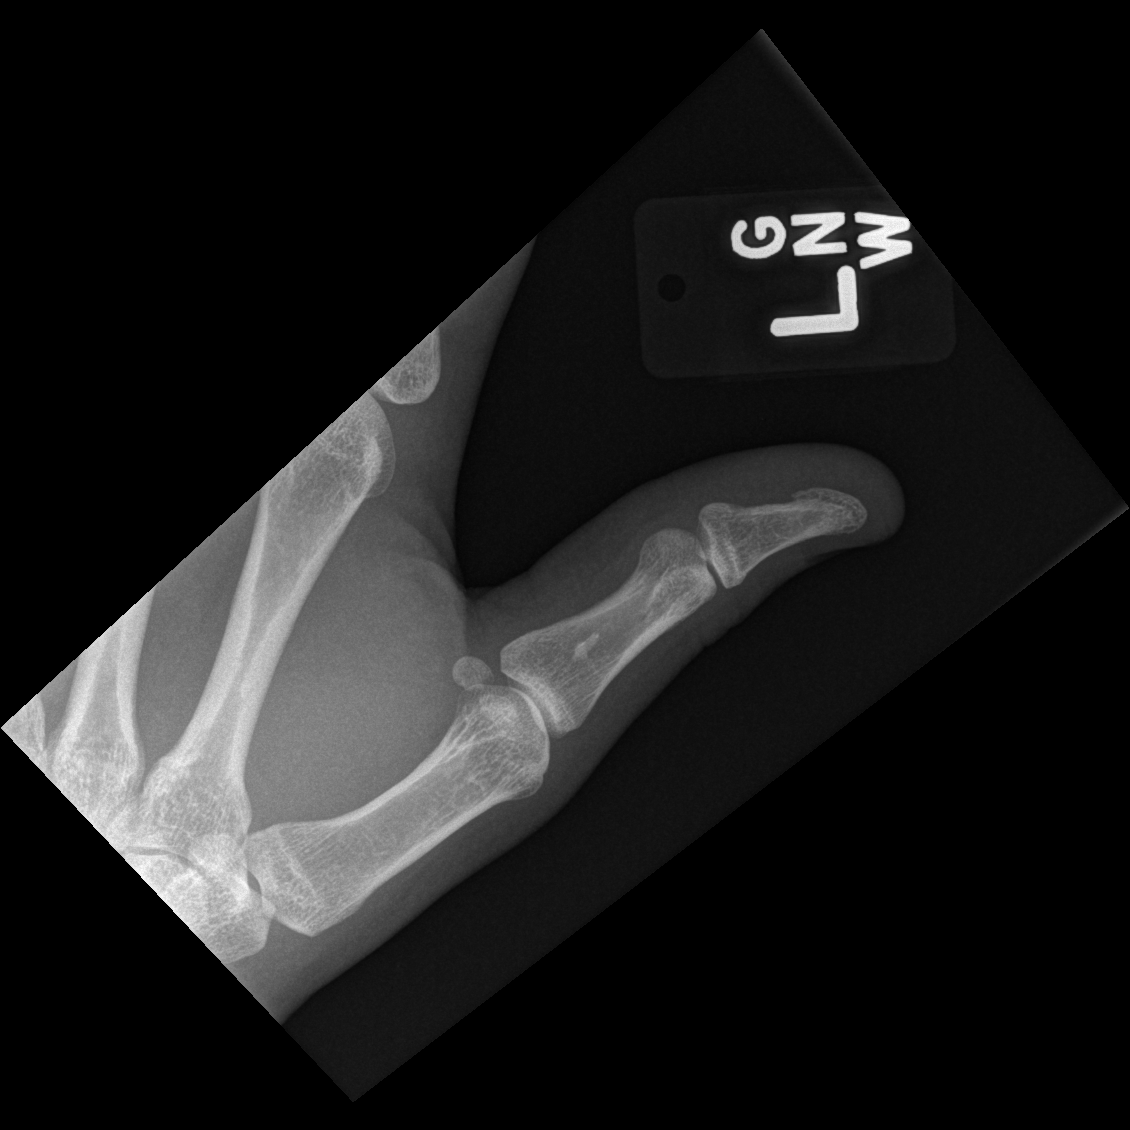

[3 of 3 positions shown; findings below may reference images not displayed]

FINDINGS: Small probable benign bone island left thumb proximal phalanx.
Otherwise normal bone mineralization. Joint spaces and alignment
preserved. No acute fracture or dislocation identified.
IMPRESSION: No acute osseous abnormality identified at the left thumb.

## 2019-02-17 ENCOUNTER — Other Ambulatory Visit: Payer: Self-pay

## 2019-02-17 ENCOUNTER — Other Ambulatory Visit: Payer: Self-pay | Admitting: Internal Medicine

## 2019-02-17 DIAGNOSIS — Z20822 Contact with and (suspected) exposure to covid-19: Secondary | ICD-10-CM

## 2019-02-22 LAB — NOVEL CORONAVIRUS, NAA: SARS-CoV-2, NAA: NOT DETECTED

## 2019-02-23 ENCOUNTER — Telehealth: Payer: Self-pay | Admitting: Hematology

## 2019-02-23 NOTE — Telephone Encounter (Signed)
Negative result was given to pt °

## 2019-05-24 ENCOUNTER — Other Ambulatory Visit: Payer: Self-pay

## 2019-05-24 DIAGNOSIS — Z20822 Contact with and (suspected) exposure to covid-19: Secondary | ICD-10-CM

## 2019-05-25 LAB — NOVEL CORONAVIRUS, NAA: SARS-CoV-2, NAA: NOT DETECTED

## 2024-09-20 ENCOUNTER — Other Ambulatory Visit: Payer: Self-pay | Admitting: Vascular Surgery

## 2024-09-25 ENCOUNTER — Other Ambulatory Visit: Payer: Self-pay

## 2024-09-25 DIAGNOSIS — I872 Venous insufficiency (chronic) (peripheral): Secondary | ICD-10-CM

## 2024-10-17 ENCOUNTER — Ambulatory Visit (HOSPITAL_COMMUNITY): Payer: Self-pay

## 2024-10-17 ENCOUNTER — Encounter: Payer: Self-pay | Admitting: Vascular Surgery
